# Patient Record
Sex: Male | Born: 1995 | Race: Black or African American | Hispanic: No | Marital: Single | Smoking: Light tobacco smoker
Health system: Southern US, Community
[De-identification: ages and names within clinical notes are randomized; demographics above are authoritative.]

## PROBLEM LIST (undated history)

## (undated) DIAGNOSIS — L309 Dermatitis, unspecified: Secondary | ICD-10-CM

## (undated) HISTORY — PX: WISDOM TOOTH EXTRACTION: SHX21

---
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
## 2017-04-07 IMAGING — CR DG HAND COMPLETE 3+V*L*
3 series · 3 of 3 positions shown · non-contrast
Comparison: None

CLINICAL DATA: Laceration to left second and third digits while
sharpening a knife, initial encounter

EXAM:
LEFT HAND - COMPLETE 3+ VIEW

[hand pa]
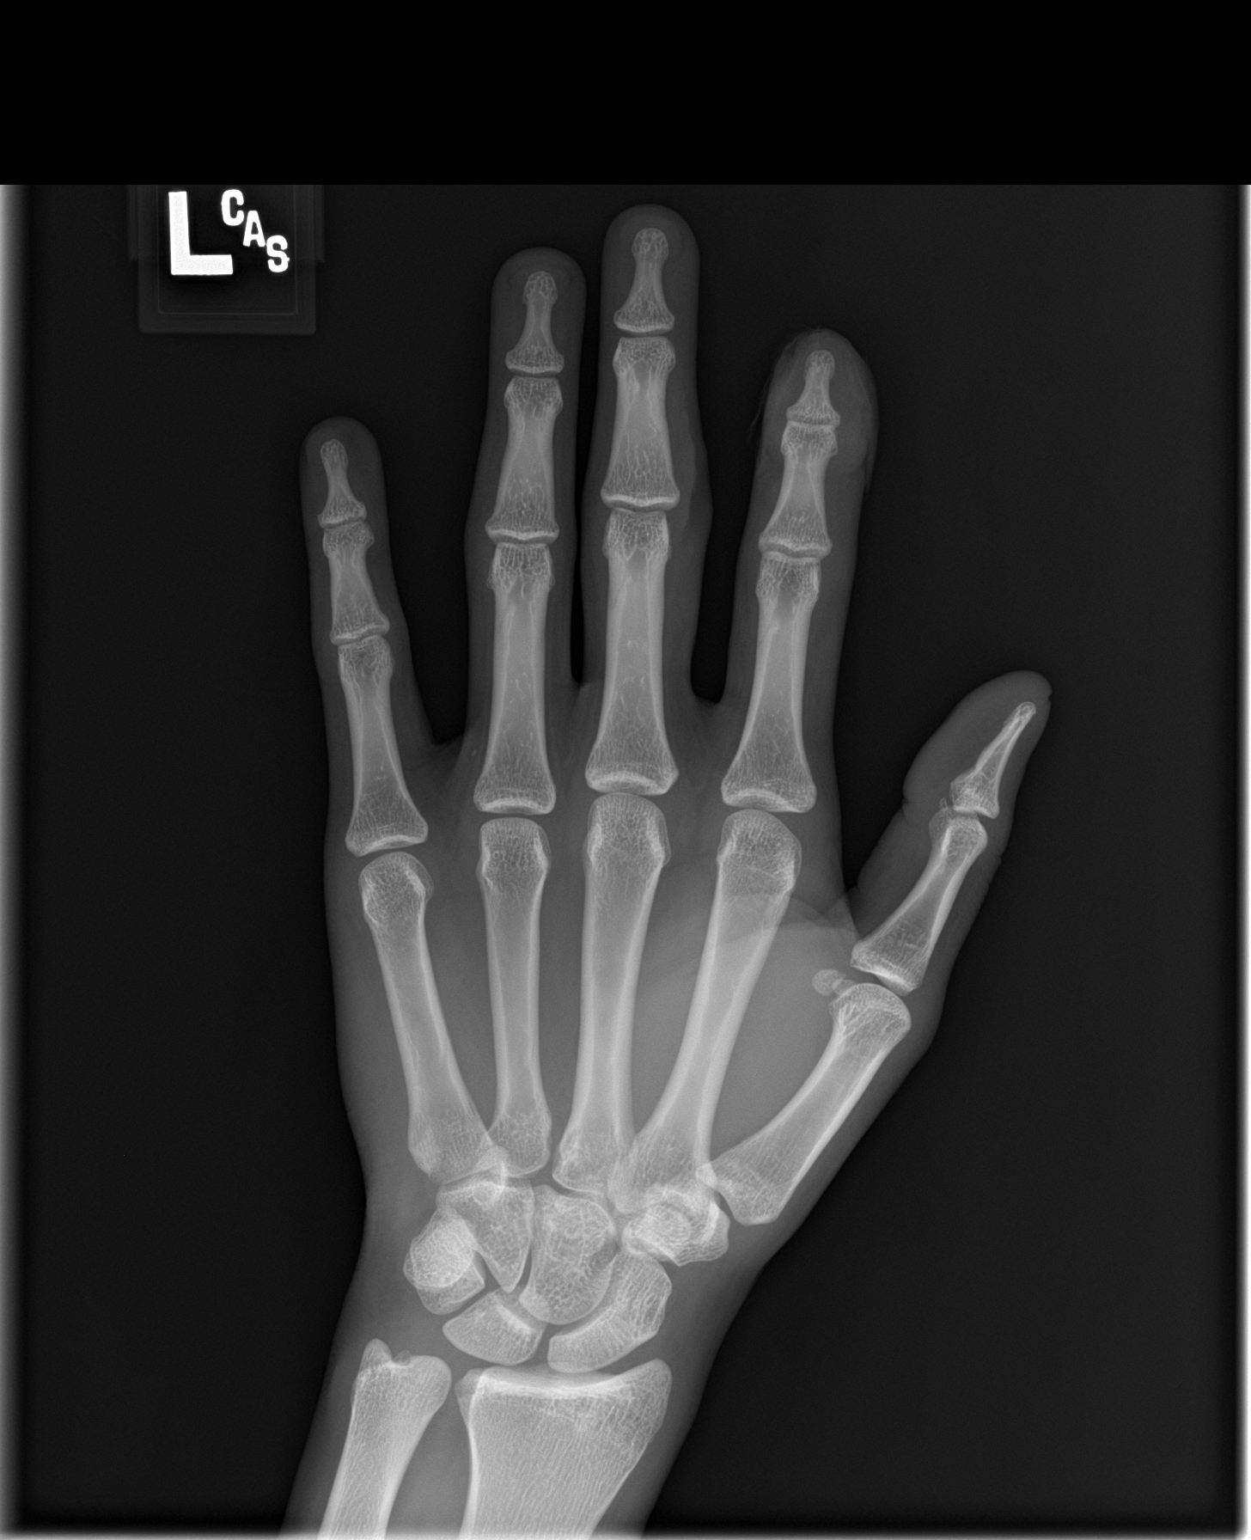

[hand obl]
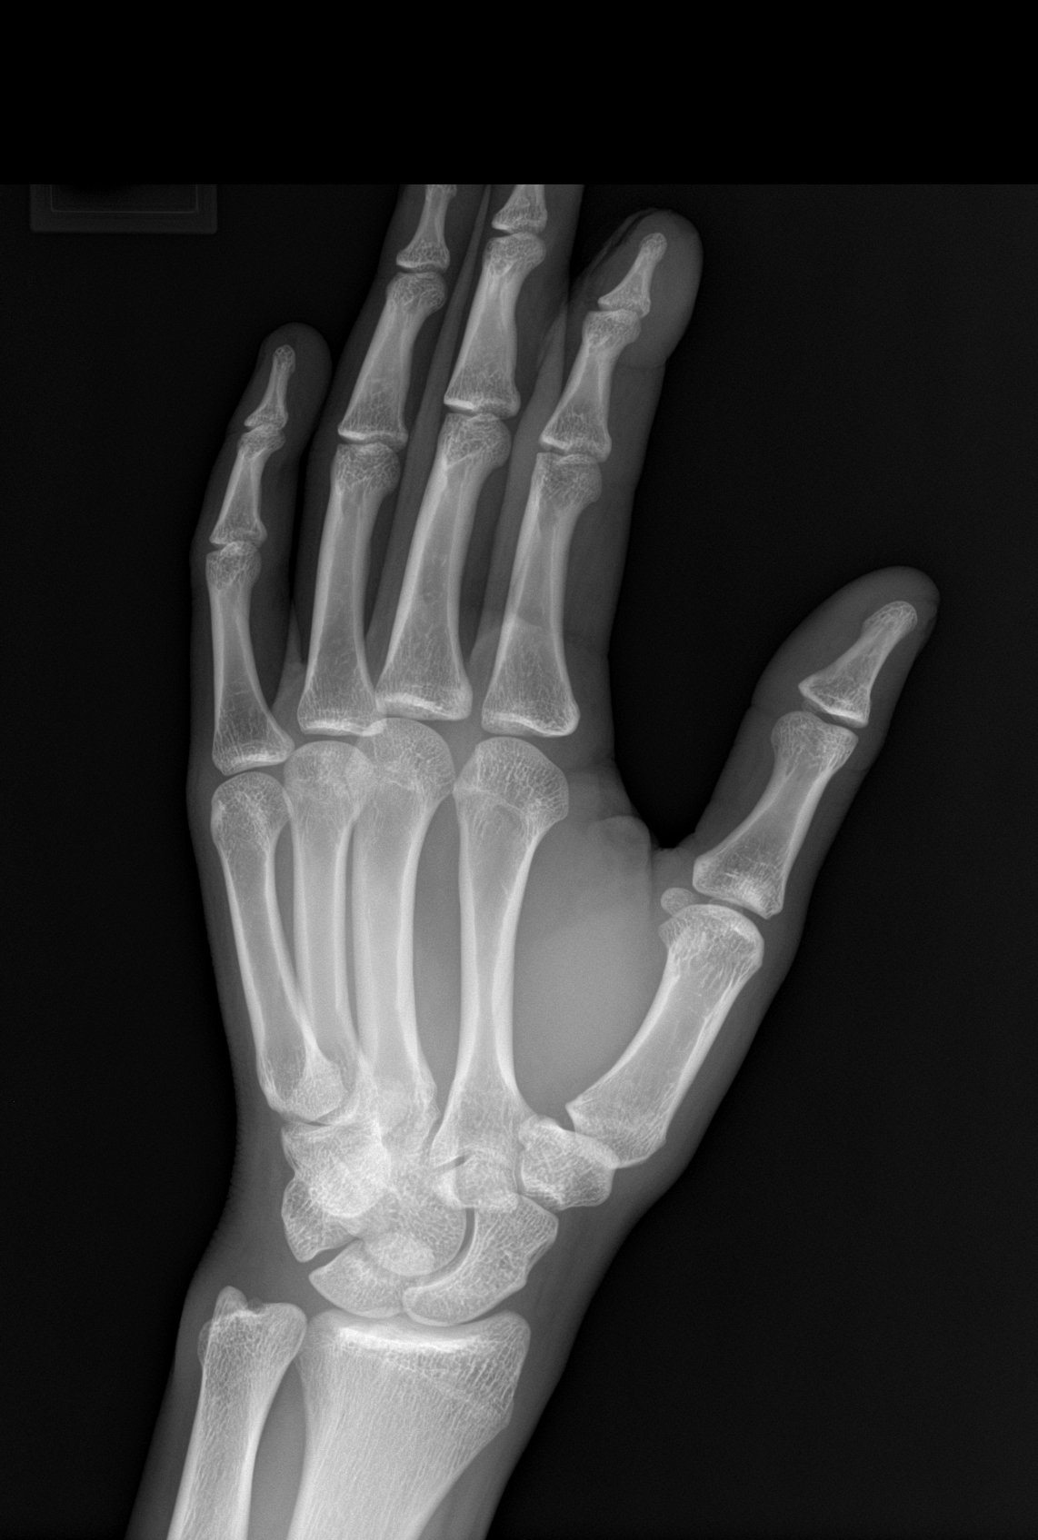

[hand lat]
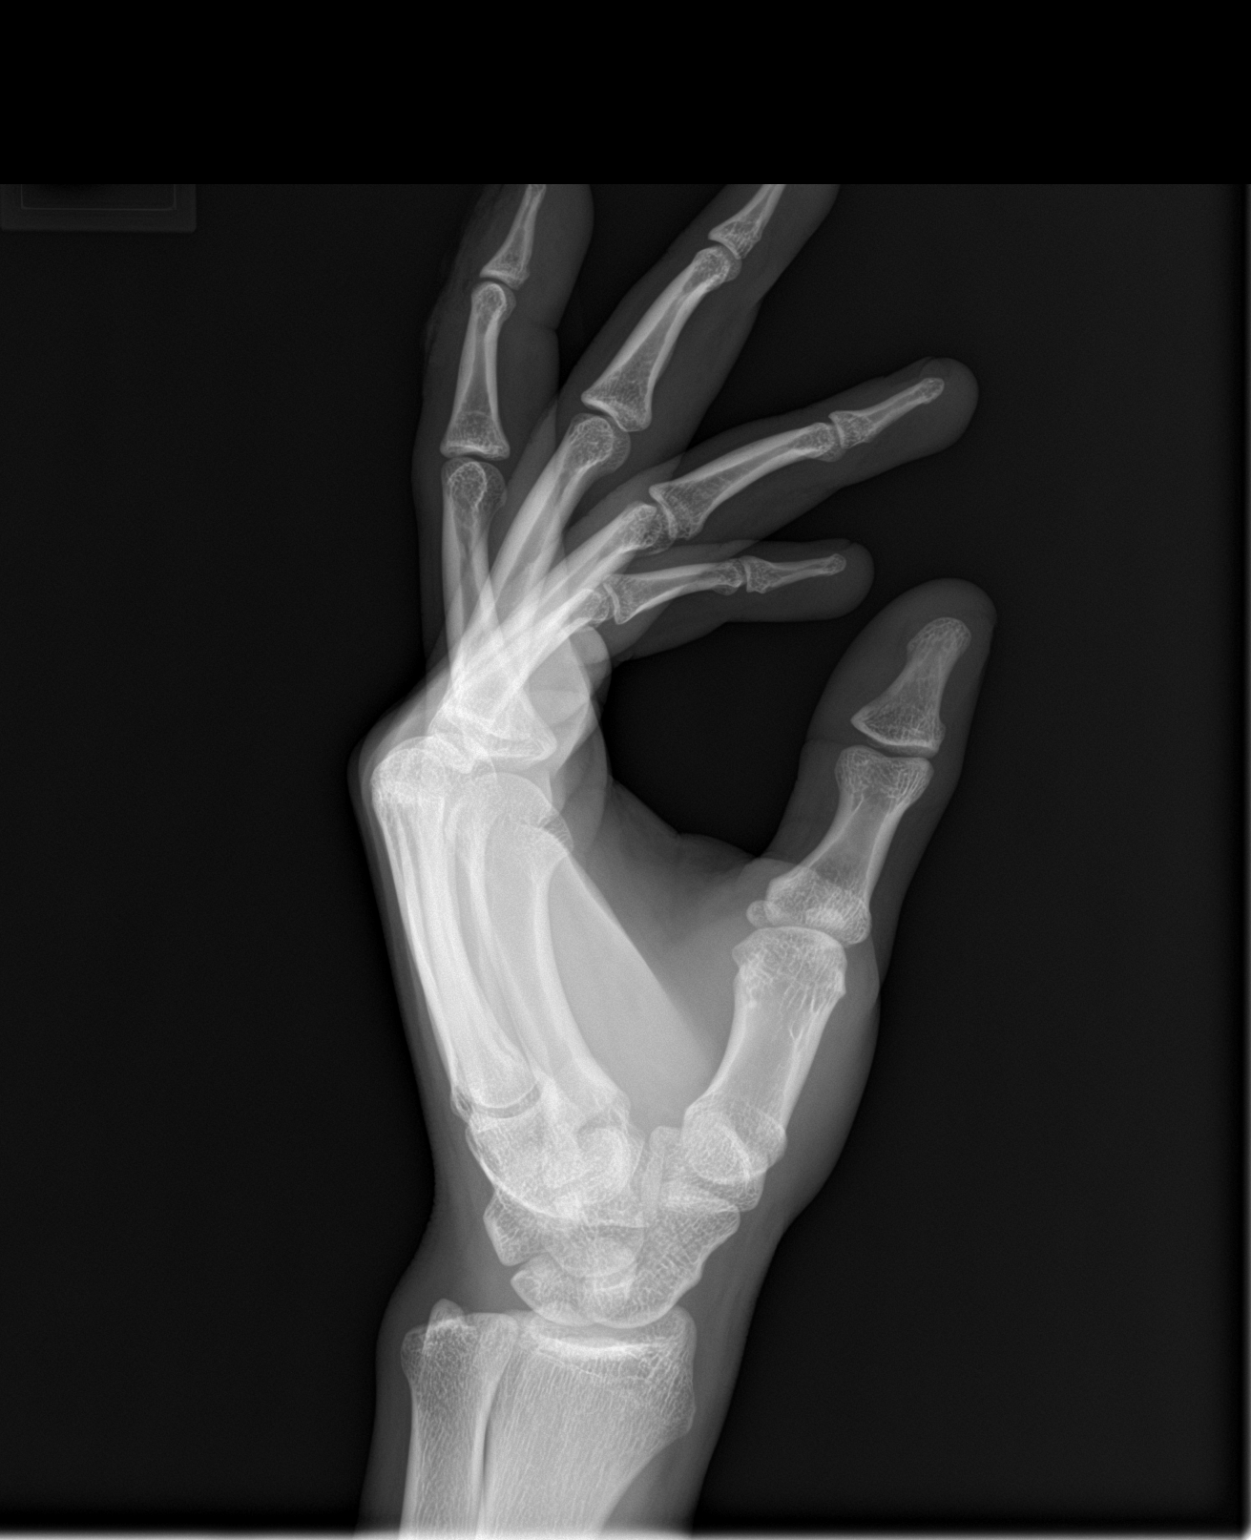

[3 of 3 positions shown; findings below may reference images not displayed]

FINDINGS: Soft tissue injury is noted consistent with the patient's given
clinical history. No underlying bony abnormality is seen.
IMPRESSION: No acute bony abnormality noted.

## 2017-04-13 ENCOUNTER — Encounter (HOSPITAL_COMMUNITY): Payer: Self-pay | Admitting: Nurse Practitioner

## 2017-04-13 ENCOUNTER — Other Ambulatory Visit: Payer: Self-pay

## 2017-04-13 ENCOUNTER — Emergency Department (HOSPITAL_COMMUNITY)
Admission: EM | Admit: 2017-04-13 | Discharge: 2017-04-13 | Disposition: A | Discharge status: Home / Self Care | Payer: Self-pay | Attending: Emergency Medicine | Admitting: Emergency Medicine

## 2017-04-13 DIAGNOSIS — Z202 Contact with and (suspected) exposure to infections with a predominantly sexual mode of transmission: Secondary | ICD-10-CM | POA: Insufficient documentation

## 2017-04-13 DIAGNOSIS — Z79899 Other long term (current) drug therapy: Secondary | ICD-10-CM | POA: Insufficient documentation

## 2017-04-13 DIAGNOSIS — F17200 Nicotine dependence, unspecified, uncomplicated: Secondary | ICD-10-CM | POA: Insufficient documentation

## 2017-04-13 MED ORDER — LIDOCAINE HCL (PF) 1 % IJ SOLN
INTRAMUSCULAR | Status: AC
  Administered 2017-04-13: 1.5 mL
  Filled 2017-04-13: qty 5

## 2017-04-13 MED ORDER — CEFTRIAXONE SODIUM 250 MG IJ SOLR
250.0000 mg | Freq: Once | INTRAMUSCULAR | Status: AC
  Administered 2017-04-13: 250 mg via INTRAMUSCULAR
  Filled 2017-04-13: qty 250

## 2017-04-13 MED ORDER — AZITHROMYCIN 250 MG PO TABS
1000.0000 mg | ORAL_TABLET | Freq: Once | ORAL | Status: AC
  Administered 2017-04-13: 1000 mg via ORAL
  Filled 2017-04-13: qty 4

## 2017-04-13 MED ORDER — METRONIDAZOLE 500 MG PO TABS
2000.0000 mg | ORAL_TABLET | Freq: Once | ORAL | Status: AC
  Administered 2017-04-13: 2000 mg via ORAL
  Filled 2017-04-13: qty 4

## 2017-04-13 NOTE — Discharge Instructions (Signed)
Follow up with the health department for any additional STD screening or treatment.

## 2017-04-13 NOTE — ED Triage Notes (Signed)
Pt states that his girlfriend notified him that she was diagnosed and treated for and STI and was advised to notify him to come in for treatment as well.He adds that she did not specify what kind of infection she was treated for. Pt denies any symptoms of penile discharge or dysuria.

## 2017-04-13 NOTE — ED Notes (Signed)
PT DISCHARGED. INSTRUCTIONS GIVEN. AAOX4. PT IN NO APPARENT DISTRESS OR PAIN. THE OPPORTUNITY TO ASK QUESTIONS WAS PROVIDED. 

## 2017-04-13 NOTE — ED Provider Notes (Signed)
[facility]-EMERGENCY DEPT Provider Note   CSN: [id number] Arrival date & time: 04/13/17  1920     History   Chief Complaint Chief Complaint  Patient presents with  . Exposure to STD    HPI Hayden Wade is a 21 y.o. male who presents to the ED for STD treatment. Patient reports that his girlfriend was here earlier due to a positive culture and had to get medications for the infection. She notified him that he needed to come in for treatment. Patient thinks his girlfriend had GC and something else. Patient reports that he has no symptoms.    HPI  Past Medical History:  Diagnosis Date  . Eczema     There are no active problems to display for this patient.   Past Surgical History:  Procedure Laterality Date  . WISDOM TOOTH EXTRACTION         Home Medications    Prior to Admission medications   Medication Sig Start Date End Date Taking? Authorizing Provider  cephALEXin (KEFLEX) 500 MG capsule Take 1 capsule (500 mg total) by mouth 4 (four) times daily. 08/24/15   Harris, Cammy CopaAbigail, PA-C  naproxen (NAPROSYN) 500 MG tablet Take 1 tablet (500 mg total) by mouth 2 (two) times daily. 08/24/15   Arthor CaptainHarris, Abigail, PA-C    Family History History reviewed. No pertinent family history.  Social History Social History   Tobacco Use  . Smoking status: Light Tobacco Smoker  . Smokeless tobacco: Never Used  Substance Use Topics  . Alcohol use: Yes  . Drug use: No     Allergies   Pork-derived products   Review of Systems Review of Systems  Genitourinary: Negative for discharge, dysuria, frequency, genital sores, penile pain, scrotal swelling, testicular pain and urgency.  All other systems reviewed and are negative.    Physical Exam Updated Vital Signs BP (!) 149/89 (BP Location: Left Arm)   Pulse (!) 101   Temp 98.7 F (37.1 C) (Oral)   Resp 18   SpO2 100%   Physical Exam  Constitutional: He appears well-developed and well-nourished. No  distress.  Eyes: EOM are normal.  Neck: Neck supple.  Cardiovascular: Tachycardia present.  Pulmonary/Chest: Effort normal.  Musculoskeletal: Normal range of motion.  Neurological: He is alert.  Skin: Skin is warm and dry.  Psychiatric: He has a normal mood and affect.  Nursing note and vitals reviewed.    ED Treatments / Results  Labs (all labs ordered are listed, but only abnormal results are displayed) Labs Reviewed  RPR  HIV ANTIBODY (ROUTINE TESTING)  GC/CHLAMYDIA PROBE AMP [facility]   Radiology No results found.  Procedures Procedures (including critical care time)  Medications Ordered in ED Medications  cefTRIAXone (ROCEPHIN) injection 250 mg (not administered)  azithromycin (ZITHROMAX) tablet 1,000 mg (not administered)  metroNIDAZOLE (FLAGYL) tablet 2,000 mg (not administered)     Initial Impression / Assessment and Plan / ED Course  I have reviewed the triage vital signs and the nursing notes. 21 y.o. male here for treatment for STD after his partner tested positive. Patient treated here in the ED with Rocephin, Zithromax and Flagyl. [facility] for any additional screening or treatment for STD's. Discussed safe sex and discussed tests pending.   Final Clinical Impressions(s) / ED Diagnoses   Final diagnoses:  STD exposure    ED Discharge Orders    None       FriendlyNeese, PattisonHope M,  NP 04/13/17 2109    Arby BarrettePfeiffer, Marcy, MD 04/15/17 [phone]

## 2017-04-14 LAB — RPR: RPR: NONREACTIVE

## 2017-04-14 LAB — HIV ANTIBODY (ROUTINE TESTING W REFLEX): HIV SCREEN 4TH GENERATION: NONREACTIVE

## 2018-04-15 ENCOUNTER — Emergency Department (HOSPITAL_COMMUNITY)
Admission: EM | Admit: 2018-04-15 | Discharge: 2018-04-15 | Disposition: A | Discharge status: Home / Self Care | Payer: Self-pay | Attending: Emergency Medicine | Admitting: Emergency Medicine

## 2018-04-15 ENCOUNTER — Emergency Department (HOSPITAL_COMMUNITY): Payer: Self-pay

## 2018-04-15 ENCOUNTER — Other Ambulatory Visit: Payer: Self-pay

## 2018-04-15 ENCOUNTER — Encounter (HOSPITAL_COMMUNITY): Payer: Self-pay | Admitting: Emergency Medicine

## 2018-04-15 DIAGNOSIS — F17200 Nicotine dependence, unspecified, uncomplicated: Secondary | ICD-10-CM | POA: Insufficient documentation

## 2018-04-15 DIAGNOSIS — R0789 Other chest pain: Secondary | ICD-10-CM | POA: Insufficient documentation

## 2018-04-15 LAB — POCT I-STAT TROPONIN I: Troponin i, poc: 0.03 ng/mL (ref 0.00–0.08)

## 2018-04-15 LAB — CBC
HEMATOCRIT: 47.3 % (ref 39.0–52.0)
Hemoglobin: 16 g/dL (ref 13.0–17.0)
MCH: 32.3 pg (ref 26.0–34.0)
MCHC: 33.8 g/dL (ref 30.0–36.0)
MCV: 95.6 fL (ref 80.0–100.0)
Platelets: 273 10*3/uL (ref 150–400)
RBC: 4.95 MIL/uL (ref 4.22–5.81)
RDW: 12.1 % (ref 11.5–15.5)
WBC: 8.2 10*3/uL (ref 4.0–10.5)
nRBC: 0 % (ref 0.0–0.2)

## 2018-04-15 LAB — BASIC METABOLIC PANEL
Anion gap: 5 (ref 5–15)
BUN: 10 mg/dL (ref 6–20)
CHLORIDE: 105 mmol/L (ref 98–111)
CO2: 28 mmol/L (ref 22–32)
CREATININE: 0.91 mg/dL (ref 0.61–1.24)
Calcium: 8.8 mg/dL — ABNORMAL LOW (ref 8.9–10.3)
GFR calc Af Amer: >60 mL/min (ref >60)
GFR calc non Af Amer: >60 mL/min (ref >60)
Glucose, Bld: 110 mg/dL — ABNORMAL HIGH (ref 70–99)
Potassium: 4 mmol/L (ref 3.5–5.1)
SODIUM: 138 mmol/L (ref 135–145)

## 2018-04-15 MED ORDER — IBUPROFEN 800 MG PO TABS: 800.0000 mg | ORAL_TABLET | Freq: Three times a day (TID) | ORAL | 0 refills | Status: AC | PRN

## 2018-04-15 NOTE — ED Provider Notes (Signed)
[facility]-EMERGENCY DEPT Provider Note   CSN: [id number] Arrival date & time: 04/15/18  1502     History   Chief Complaint Chief Complaint  Patient presents with  . Chest Pain    HPI Hayden Wade is a 22 y.o. male.  The history is provided by the patient. No language interpreter was used.  Chest Pain     Hayden Wade is a 22 y.o. male who presents to the Emergency Department complaining of chest pain. He reports of four days of central sharp chest pain. Symptoms began four days ago when he was coughing. Over the last two days he is noted that he has pain to his central chest when he bends to pick up box projects. He has had a cough for about a week and a half, overall the cough is resolving. No hemoptysis, shortness of breath, pain on inspiration, fevers, abdominal pain, nausea, vomiting, leg swelling or pain. He does smoke cigarettes. No drug use. No personal or family history of blood clots. He has not tried anything for the pain. Past Medical History:  Diagnosis Date  . Eczema     There are no active problems to display for this patient.   Past Surgical History:  Procedure Laterality Date  . WISDOM TOOTH EXTRACTION          Home Medications    Prior to Admission medications   Medication Sig Start Date End Date Taking? Authorizing Provider  cephALEXin (KEFLEX) 500 MG capsule Take 1 capsule (500 mg total) by mouth 4 (four) times daily. Patient not taking: Reported on 04/15/2018 08/24/15   Arthor Captain, PA-C  ibuprofen (ADVIL,MOTRIN) 800 MG tablet Take 1 tablet (800 mg total) by mouth every 8 (eight) hours as needed. 04/15/18   Tilden Fossa, MD  naproxen (NAPROSYN) 500 MG tablet Take 1 tablet (500 mg total) by mouth 2 (two) times daily. Patient not taking: Reported on 04/15/2018 08/24/15   Arthor Captain, PA-C    Family History No family history on file.  Social History Social History   Tobacco Use  . Smoking status: Light  Tobacco Smoker  . Smokeless tobacco: Never Used  Substance Use Topics  . Alcohol use: Yes  . Drug use: No     Allergies   Shellfish allergy and Pork-derived products   Review of Systems Review of Systems  Cardiovascular: Positive for chest pain.  All other systems reviewed and are negative.    Physical Exam Updated Vital Signs BP 138/85 (BP Location: Right Arm)   Pulse 91   Temp 97.9 F (36.6 C) (Oral)   Resp 17   Ht 5' 7.5" (1.715 m)   Wt 70.3 kg   SpO2 100%   BMI 23.92 kg/m   Physical Exam  Constitutional: He is oriented to person, place, and time. He appears well-developed and well-nourished.  HENT:  Head: Normocephalic and atraumatic.  Cardiovascular: Normal rate and regular rhythm.  No murmur heard. Pulmonary/Chest: Effort normal and breath sounds normal. No respiratory distress. He exhibits no tenderness.  Abdominal: Soft. There is no tenderness. There is no rebound and no guarding.  Musculoskeletal: He exhibits no edema or tenderness.  2+ radial pulses bilaterally  Neurological: He is alert and oriented to person, place, and time.  Skin: Skin is warm and dry.  Psychiatric: He has a normal mood and affect. His behavior is normal.  Nursing note and vitals reviewed.    ED Treatments / Results  Labs (all labs ordered are  listed, but only abnormal results are displayed) Labs Reviewed  BASIC METABOLIC PANEL - Abnormal; Notable for the following components:      Result Value   Glucose, Bld 110 (*)    Calcium 8.8 (*)    All other components within normal limits  CBC  I-STAT TROPONIN, ED  POCT I-STAT TROPONIN I    EKG EKG Interpretation  Date/Time:  Monday April 15 2018 15:08:54 EST Ventricular Rate:  93 PR Interval:    QRS Duration: 80 QT Interval:  327 QTC Calculation: 407 R Axis:   98 Text Interpretation:  Sinus rhythm Borderline right axis deviation no prior available for comparison Confirmed by Tilden Fossaees, Daden Mahany [phone]) on 04/15/2018  4:21:39 PM   Radiology Dg Chest 2 View  Result Date: 04/15/2018 CLINICAL DATA:  Central chest pain, 5 days duration. Productive cough. EXAM: CHEST - 2 VIEW COMPARISON:  None. FINDINGS: Heart size is normal. Mediastinal shadows are normal. The lungs are clear. No bronchial thickening. No infiltrate, mass, effusion or collapse. Pulmonary vascularity is normal. No bony abnormality. IMPRESSION: Normal chest Electronically Signed   By: Paulina FusiMark  Shogry M.D.   On: 04/15/2018 15:45    Procedures Procedures (including critical care time)  Medications Ordered in ED Medications - No data to display   Initial Impression / Assessment and Plan / ED Course  I have reviewed the triage vital signs and the nursing notes.  Pertinent labs & imaging results that were available during my care of the patient were reviewed by me and considered in my medical decision making (see chart for details).     Pt presents to the emergency department for evaluation of four days of chest pain. He is in no acute distress on evaluation with clear lungs. No current evidence of pneumonia. Presentation is not consistent with ACS, PE, dissection, pericarditis. Discussed with patient home care for chest wall pain. Discussed outpatient follow-up and return precautions.  Final Clinical Impressions(s) / ED Diagnoses   Final diagnoses:  Chest wall pain    ED Discharge Orders         Ordered    ibuprofen (ADVIL,MOTRIN) 800 MG tablet  Every 8 hours PRN     04/15/18 1633           Tilden Fossaees, Iness Pangilinan, MD 04/15/18 1635

## 2018-04-15 NOTE — ED Triage Notes (Signed)
Pt c/o intermittent chest pains for 4 days. Today while at work was picking something up when pains happened and pt repots hurting so bad couldn't pick up the package. Reports cough with brownish phlegm for about week.

## 2018-09-28 ENCOUNTER — Emergency Department (HOSPITAL_COMMUNITY)
Admission: EM | Admit: 2018-09-28 | Discharge: 2018-09-29 | Disposition: A | Discharge status: Home / Self Care | Payer: Self-pay | Attending: Emergency Medicine | Admitting: Emergency Medicine

## 2018-09-28 ENCOUNTER — Encounter (HOSPITAL_COMMUNITY): Payer: Self-pay

## 2018-09-28 ENCOUNTER — Other Ambulatory Visit: Payer: Self-pay

## 2018-09-28 DIAGNOSIS — R369 Urethral discharge, unspecified: Secondary | ICD-10-CM | POA: Insufficient documentation

## 2018-09-28 DIAGNOSIS — R300 Dysuria: Secondary | ICD-10-CM | POA: Insufficient documentation

## 2018-09-28 DIAGNOSIS — N4889 Other specified disorders of penis: Secondary | ICD-10-CM | POA: Insufficient documentation

## 2018-09-28 DIAGNOSIS — F17200 Nicotine dependence, unspecified, uncomplicated: Secondary | ICD-10-CM | POA: Insufficient documentation

## 2018-09-28 NOTE — ED Notes (Signed)
Bed: WA13 Expected date:  Expected time:  Means of arrival:  Comments: 

## 2018-09-28 NOTE — ED Provider Notes (Signed)
[facility]-EMERGENCY DEPT Provider Note   CSN: [id number] Arrival date & time: 09/28/18  2104    History   Chief Complaint Chief Complaint  Patient presents with  . Penile Discharge    HPI Hayden Wade is a 23 y.o. male with a history of eczema who presents to the emergency department with a chief complaint of penile discharge.  The patient reports penile discharge and burning for the last 24 hours.  He denies urinary frequency or hesitancy, nausea, vomiting, fever, chills, abdominal pain, diarrhea, constipation or testicular pain or swelling.  He reports that he was recently sexually active with one new male partner.  He reports he wore a condom when they had vaginal intercourse, but did not use any kind of protection with oral sex.  No recent anal sex.  No treatment prior to arrival.     The history is provided by the patient. No language interpreter was used.    Past Medical History:  Diagnosis Date  . Eczema     There are no active problems to display for this patient.   Past Surgical History:  Procedure Laterality Date  . WISDOM TOOTH EXTRACTION          Home Medications    Prior to Admission medications   Medication Sig Start Date End Date Taking? Authorizing Provider  cephALEXin (KEFLEX) 500 MG capsule Take 1 capsule (500 mg total) by mouth 4 (four) times daily. Patient not taking: Reported on 04/15/2018 08/24/15   Arthor Captain, PA-C  ibuprofen (ADVIL,MOTRIN) 800 MG tablet Take 1 tablet (800 mg total) by mouth every 8 (eight) hours as needed. 04/15/18   Tilden Fossa, MD  naproxen (NAPROSYN) 500 MG tablet Take 1 tablet (500 mg total) by mouth 2 (two) times daily. Patient not taking: Reported on 04/15/2018 08/24/15   Arthor Captain, PA-C    Family History History reviewed. No pertinent family history.  Social History Social History   Tobacco Use  . Smoking status: Light Tobacco Smoker  . Smokeless tobacco: Never Used   Substance Use Topics  . Alcohol use: Yes  . Drug use: No     Allergies   Shellfish allergy and Pork-derived products   Review of Systems Review of Systems  Constitutional: Negative for activity change, chills and fever.  HENT: Negative for congestion and mouth sores.   Respiratory: Negative for shortness of breath.   Cardiovascular: Negative for chest pain.  Gastrointestinal: Negative for abdominal pain, diarrhea, nausea and vomiting.  Genitourinary: Positive for discharge and penile pain. Negative for dysuria, frequency, hematuria and urgency.  Musculoskeletal: Negative for back pain.  Skin: Negative for rash.  Neurological: Negative for dizziness, weakness and numbness.     Physical Exam Updated Vital Signs BP 137/80 (BP Location: Left Arm)   Pulse 70   Temp 98.1 F (36.7 C) (Oral)   Resp 18   Ht 5\' 8"  (1.727 m)   Wt 74.8 kg   SpO2 97%   BMI 25.09 kg/m   Physical Exam Vitals signs and nursing note reviewed.  Constitutional:      Appearance: He is well-developed.  HENT:     Head: Normocephalic.  Eyes:     Conjunctiva/sclera: Conjunctivae normal.  Neck:     Musculoskeletal: Neck supple.  Cardiovascular:     Rate and Rhythm: Normal rate and regular rhythm.     Heart sounds: No murmur.  Pulmonary:     Effort: Pulmonary effort is normal.  Abdominal:  General: There is no distension.     Palpations: Abdomen is soft.  Genitourinary:    Penis: Discharge present. No phimosis, paraphimosis, erythema or tenderness.      Scrotum/Testes: Normal.  Skin:    General: Skin is warm and dry.  Neurological:     Mental Status: He is alert.  Psychiatric:        Behavior: Behavior normal.      ED Treatments / Results  Labs (all labs ordered are listed, but only abnormal results are displayed) Labs Reviewed  GC/CHLAMYDIA PROBE AMP ([place]) NOT AT [facility]    EKG None  Radiology No results found.  Procedures Procedures (including critical care time)   Medications Ordered in ED Medications  cefTRIAXone (ROCEPHIN) injection 250 mg (has no administration in time range)  azithromycin (ZITHROMAX) tablet 1,000 mg (has no administration in time range)  sterile water (preservative free) injection (has no administration in time range)     Initial Impression / Assessment and Plan / ED Course  I have reviewed the triage vital signs and the nursing notes.  Pertinent labs & imaging results that were available during my care of the patient were reviewed by me and considered in my medical decision making (see chart for details).        23 year old male presenting with penile discharge and pain for the last 24 hours.  No constitutional symptoms.  No nausea, vomiting, or diarrhea.  He reports that he recently had a new male sexual partner.  States that he wore a condom during sexual intercourse, but not while having oral sex.  He is having no other urinary complaints.  Patient is afebrile without abdominal tenderness, abdominal pain or painful bowel movements to indicate prostatitis.  No tenderness to palpation of the testes or epididymis to suggest orchitis or epididymitis.  STD cultures obtained including gonorrhea and chlamydia.  Declined HIV and syphilis testing.  Patient to be discharged with instructions to follow up with PCP. Discussed importance of using protection when sexually active. Pt understands that they have GC/Chlamydia cultures pending and that they will need to inform all sexual partners if results return positive. Patient has been treated prophylactically with azithromycin and Rocephin.    Final Clinical Impressions(s) / ED Diagnoses   Final diagnoses:  Penile discharge    ED Discharge Orders    None       Barkley BoardsMcDonald, Alessia Gonsalez A, PA-C 09/29/18 0114    Molpus, Jonny RuizJohn, MD 09/29/18 [id number]

## 2018-09-28 NOTE — ED Triage Notes (Signed)
For 2 days white penile discharge and burning with urination voiced.

## 2018-09-29 ENCOUNTER — Other Ambulatory Visit: Payer: Self-pay

## 2018-09-29 MED ORDER — AZITHROMYCIN 250 MG PO TABS
1000.0000 mg | ORAL_TABLET | Freq: Once | ORAL | Status: AC
  Administered 2018-09-29: 01:00:00 1000 mg via ORAL
  Filled 2018-09-29: qty 4

## 2018-09-29 MED ORDER — STERILE WATER FOR INJECTION IJ SOLN
INTRAMUSCULAR | Status: AC
  Administered 2018-09-29: 1.2 mL
  Filled 2018-09-29: qty 10

## 2018-09-29 MED ORDER — CEFTRIAXONE SODIUM 250 MG IJ SOLR
250.0000 mg | Freq: Once | INTRAMUSCULAR | Status: AC
  Administered 2018-09-29: 250 mg via INTRAMUSCULAR
  Filled 2018-09-29: qty 250

## 2018-09-29 NOTE — Discharge Instructions (Addendum)
Thank you for allowing me to provide your care today in the emergency department.  Your gonorrhea and chlamydia tests are pending.  You have already been treated for both. If any of these tests are positive, someone from the hospital will call you at the number that we discussed.  You can also download the my chart application and use the number on your discharge paperwork to register for an account.  Lab results are typically available in the app 72 hours after they have resulted.  To protect yourself from these illnesses, you should wear a condom for all types of intercourse, including oral or anal sex.  [facility] recommends abstaining from all sexual activities for 7 days after being treated.  If any of your tests are positive, it is important that you let all of your sexual partners know so they can seek treatment.  It is also important to note that if you were treated and have sex with someone that has not been treated that you can be reinfected.  You declined blood testing for HIV or syphilis today.  Consider getting screened for these illnesses in the future since both are transmitted sexually.  Call the number on your discharge paperwork to get established with a primary care provider.   Return to the emergency department if you develop significantly worsening symptoms such as fever, chills, severe pain, redness, or swelling to the penis or testicles, or if the discharge from your penis does not improve.

## 2018-10-01 LAB — GC/CHLAMYDIA PROBE AMP (~~LOC~~) NOT AT ARMC
Chlamydia: NEGATIVE
Neisseria Gonorrhea: POSITIVE — AB

## 2018-12-27 ENCOUNTER — Other Ambulatory Visit: Payer: Self-pay

## 2018-12-27 ENCOUNTER — Encounter (HOSPITAL_COMMUNITY): Payer: Self-pay | Admitting: Emergency Medicine

## 2018-12-27 ENCOUNTER — Emergency Department (HOSPITAL_COMMUNITY)
Admission: EM | Admit: 2018-12-27 | Discharge: 2018-12-27 | Disposition: A | Discharge status: Home / Self Care | Payer: Self-pay | Attending: Emergency Medicine | Admitting: Emergency Medicine

## 2018-12-27 DIAGNOSIS — Z711 Person with feared health complaint in whom no diagnosis is made: Secondary | ICD-10-CM

## 2018-12-27 DIAGNOSIS — F17200 Nicotine dependence, unspecified, uncomplicated: Secondary | ICD-10-CM | POA: Insufficient documentation

## 2018-12-27 DIAGNOSIS — Z202 Contact with and (suspected) exposure to infections with a predominantly sexual mode of transmission: Secondary | ICD-10-CM | POA: Insufficient documentation

## 2018-12-27 LAB — URINALYSIS, COMPLETE (UACMP) WITH MICROSCOPIC
Bacteria, UA: NONE SEEN
Bilirubin Urine: NEGATIVE
Glucose, UA: NEGATIVE mg/dL
Hgb urine dipstick: NEGATIVE
Ketones, ur: 5 mg/dL — AB
Leukocytes,Ua: NEGATIVE
Nitrite: NEGATIVE
Protein, ur: NEGATIVE mg/dL
Specific Gravity, Urine: 1.024 (ref 1.005–1.030)
pH: 6 (ref 5.0–8.0)

## 2018-12-27 MED ORDER — CEFTRIAXONE SODIUM 250 MG IJ SOLR
250.0000 mg | Freq: Once | INTRAMUSCULAR | Status: AC
  Administered 2018-12-27: 250 mg via INTRAMUSCULAR
  Filled 2018-12-27: qty 250

## 2018-12-27 MED ORDER — AZITHROMYCIN 250 MG PO TABS
1000.0000 mg | ORAL_TABLET | Freq: Once | ORAL | Status: AC
  Administered 2018-12-27: 1000 mg via ORAL
  Filled 2018-12-27: qty 4

## 2018-12-27 MED ORDER — STERILE WATER FOR INJECTION IJ SOLN
INTRAMUSCULAR | Status: AC
  Administered 2018-12-27: 10 mL
  Filled 2018-12-27: qty 10

## 2018-12-27 MED ORDER — ONDANSETRON 4 MG PO TBDP
4.0000 mg | ORAL_TABLET | Freq: Once | ORAL | Status: AC
  Administered 2018-12-27: 4 mg via ORAL
  Filled 2018-12-27: qty 1

## 2018-12-27 NOTE — Discharge Instructions (Addendum)
Today you have been treated for gonorrhea and chlamydia.  The test to determine if you have these will take a few days. They will only call you if your tests come back positive, no news is good news. In the result that your tests are positive you have already been treated.  

## 2018-12-27 NOTE — ED Provider Notes (Signed)
[facility] DEPT Provider Note   CSN: [id number] Arrival date & time: 12/27/18  1502    History   Chief Complaint Chief Complaint  Patient presents with  . Exposure to STD    HPI Hayden Wade is a 23 y.o. male with no pertinent past medical history who presents today for concern of a sexually transmitted infection.  He reports that he and his girlfriend had a three-way with another person who then told him that she tested positive for chlamydia and trichomoniasis he thinks however is unsure.  He denies any symptoms.  No testicular pain.  No rectal pain or pain with bowel movements.  He states that he was wearing a condom during this.  He states that he does not think his partner is having any symptoms.      HPI  Past Medical History:  Diagnosis Date  . Eczema     There are no active problems to display for this patient.   Past Surgical History:  Procedure Laterality Date  . WISDOM TOOTH EXTRACTION          Home Medications    Prior to Admission medications   Medication Sig Start Date End Date Taking? Authorizing Provider  cephALEXin (KEFLEX) 500 MG capsule Take 1 capsule (500 mg total) by mouth 4 (four) times daily. Patient not taking: Reported on 04/15/2018 08/24/15   Margarita Mail, PA-C  ibuprofen (ADVIL,MOTRIN) 800 MG tablet Take 1 tablet (800 mg total) by mouth every 8 (eight) hours as needed. 04/15/18   Quintella Reichert, MD  naproxen (NAPROSYN) 500 MG tablet Take 1 tablet (500 mg total) by mouth 2 (two) times daily. Patient not taking: Reported on 04/15/2018 08/24/15   Margarita Mail, PA-C    Family History No family history on file.  Social History Social History   Tobacco Use  . Smoking status: Light Tobacco Smoker  . Smokeless tobacco: Never Used  Substance Use Topics  . Alcohol use: Yes  . Drug use: No     Allergies   Shellfish allergy and Pork-derived products   Review of Systems Review of Systems   Constitutional: Negative for chills and fever.  Gastrointestinal: Negative for blood in stool and rectal pain.  Genitourinary: Negative for decreased urine volume, discharge, dysuria, frequency, genital sores, penile pain, penile swelling, scrotal swelling, testicular pain and urgency.  Neurological: Negative for weakness and headaches.  All other systems reviewed and are negative.    Physical Exam Updated Vital Signs BP 131/69 (BP Location: Left Arm)   Pulse 88   Temp 98.6 F (37 C) (Oral)   Resp 18   Wt 74.3 kg   SpO2 99%   BMI 24.92 kg/m   Physical Exam Vitals signs and nursing note reviewed. Exam conducted with a chaperone present (Male ED tech).  Constitutional:      General: He is not in acute distress.    Appearance: He is well-developed. He is not diaphoretic.  HENT:     Head: Normocephalic and atraumatic.  Eyes:     General: No scleral icterus.       Right eye: No discharge.        Left eye: No discharge.     Conjunctiva/sclera: Conjunctivae normal.  Neck:     Musculoskeletal: Normal range of motion.  Cardiovascular:     Rate and Rhythm: Normal rate and regular rhythm.  Pulmonary:     Effort: Pulmonary effort is normal. No respiratory distress.     Breath sounds:  No stridor.  Genitourinary:    Penis: Normal.      Comments: No discharge Musculoskeletal:        General: No deformity.  Skin:    General: Skin is warm and dry.  Neurological:     Mental Status: He is alert.     Motor: No abnormal muscle tone.  Psychiatric:        Behavior: Behavior normal.      ED Treatments / Results  Labs (all labs ordered are listed, but only abnormal results are displayed) Labs Reviewed  URINALYSIS, COMPLETE (UACMP) WITH MICROSCOPIC - Abnormal; Notable for the following components:      Result Value   Ketones, ur 5 (*)    All other components within normal limits  GC/CHLAMYDIA PROBE AMP ([place]) NOT AT [facility]    EKG None  Radiology No results found.   Procedures Procedures (including critical care time)  Medications Ordered in ED Medications  cefTRIAXone (ROCEPHIN) injection 250 mg (250 mg Intramuscular Given 12/27/18 1651)  azithromycin (ZITHROMAX) tablet 1,000 mg (1,000 mg Oral Given 12/27/18 1651)  ondansetron (ZOFRAN-ODT) disintegrating tablet 4 mg (4 mg Oral Given 12/27/18 1651)  sterile water (preservative free) injection (10 mLs  Given 12/27/18 1654)     Initial Impression / Assessment and Plan / ED Course  I have reviewed the triage vital signs and the nursing notes.  Pertinent labs & imaging results that were available during my care of the patient were reviewed by me and considered in my medical decision making (see chart for details).       Patient is afebrile without , abdominal pain or painful bowel movements to indicate prostatitis.  No testicular pain  STD cultures obtained includinggonorrhea and chlamydia.  Patient was offered HIV and syphilis testing which he made the informed decision to refuse.  Patient to be discharged with instructions to follow up with PCP. Discussed importance of using protection when sexually active. Pt understands that they have GC/Chlamydia cultures pending and that they will need to inform all sexual partners if results return positive. Patient has been treated prophylactically with azithromycin and Rocephin.   Due to concerns of possible trichomoniasis urine microscopy was sent, none was seen.   Return precautions were discussed with patient who states their understanding.  At the time of discharge patient denied any unaddressed complaints or concerns.  Patient is agreeable for discharge home.   Final Clinical Impressions(s) / ED Diagnoses   Final diagnoses:  Concern about STD in male without diagnosis    ED Discharge Orders    None       Norman ClayHammond, Nashayla Telleria W, PA-C 12/27/18 1734    Loren RacerYelverton, David, MD 12/29/18 2235

## 2018-12-27 NOTE — ED Triage Notes (Signed)
Pt reports that one of his sexual partners informed him that they had STD and he needed treatment. Denies discharge, urinary problems or pains.

## 2018-12-31 LAB — GC/CHLAMYDIA PROBE AMP (~~LOC~~) NOT AT ARMC
Chlamydia: NEGATIVE
Neisseria Gonorrhea: NEGATIVE

## 2019-07-21 ENCOUNTER — Encounter (HOSPITAL_COMMUNITY): Payer: Self-pay

## 2019-07-21 ENCOUNTER — Emergency Department (HOSPITAL_COMMUNITY)
Admission: EM | Admit: 2019-07-21 | Discharge: 2019-07-21 | Disposition: A | Discharge status: Home / Self Care | Payer: Self-pay | Attending: Emergency Medicine | Admitting: Emergency Medicine

## 2019-07-21 ENCOUNTER — Other Ambulatory Visit: Payer: Self-pay

## 2019-07-21 DIAGNOSIS — F17200 Nicotine dependence, unspecified, uncomplicated: Secondary | ICD-10-CM | POA: Insufficient documentation

## 2019-07-21 DIAGNOSIS — R369 Urethral discharge, unspecified: Secondary | ICD-10-CM | POA: Insufficient documentation

## 2019-07-21 MED ORDER — CEFTRIAXONE SODIUM 250 MG IJ SOLR
250.0000 mg | Freq: Once | INTRAMUSCULAR | Status: AC
  Administered 2019-07-21: 02:00:00 250 mg via INTRAMUSCULAR
  Filled 2019-07-21: qty 250

## 2019-07-21 MED ORDER — METRONIDAZOLE 500 MG PO TABS: 500.0000 mg | ORAL_TABLET | Freq: Two times a day (BID) | ORAL | 0 refills | Status: AC

## 2019-07-21 MED ORDER — AZITHROMYCIN 250 MG PO TABS
1000.0000 mg | ORAL_TABLET | Freq: Once | ORAL | Status: AC
  Administered 2019-07-21: 02:00:00 1000 mg via ORAL
  Filled 2019-07-21: qty 4

## 2019-07-21 MED ORDER — LIDOCAINE HCL 1 % IJ SOLN
INTRAMUSCULAR | Status: AC
  Administered 2019-07-21: 02:00:00 0.9 mL
  Filled 2019-07-21: qty 20

## 2019-07-21 NOTE — ED Provider Notes (Signed)
[place] DEPT Provider Note   CSN: [id number] Arrival date & time: 07/21/19  0109     History Chief Complaint  Patient presents with  . Penile Discharge    Hayden Wade is a 24 y.o. male.  Patient with history of recurrent STD infections presents with penile discharge since this morning. No fever, abdominal pain, nausea, testicular pain or trouble urinating.   The history is provided by the patient. No language interpreter was used.       Past Medical History:  Diagnosis Date  . Eczema     There are no problems to display for this patient.   Past Surgical History:  Procedure Laterality Date  . WISDOM TOOTH EXTRACTION         No family history on file.  Social History   Tobacco Use  . Smoking status: Light Tobacco Smoker  . Smokeless tobacco: Never Used  Substance Use Topics  . Alcohol use: Yes  . Drug use: No    Home Medications Prior to Admission medications   Medication Sig Start Date End Date Taking? Authorizing Provider  cephALEXin (KEFLEX) 500 MG capsule Take 1 capsule (500 mg total) by mouth 4 (four) times daily. Patient not taking: Reported on 04/15/2018 08/24/15   Margarita Mail, PA-C  ibuprofen (ADVIL,MOTRIN) 800 MG tablet Take 1 tablet (800 mg total) by mouth every 8 (eight) hours as needed. 04/15/18   Quintella Reichert, MD  naproxen (NAPROSYN) 500 MG tablet Take 1 tablet (500 mg total) by mouth 2 (two) times daily. Patient not taking: Reported on 04/15/2018 08/24/15   Margarita Mail, PA-C    Allergies    Shellfish allergy and Pork-derived products  Review of Systems   Review of Systems  Constitutional: Negative for chills and fever.  Gastrointestinal: Negative.  Negative for abdominal pain and nausea.  Genitourinary: Positive for discharge. Negative for difficulty urinating and testicular pain.  Musculoskeletal: Negative.   Skin: Negative.   Neurological: Negative.     Physical Exam Updated Vital  Signs BP (!) 144/89 (BP Location: Left Arm)   Pulse 77   Temp 97.7 F (36.5 C) (Oral)   Resp 16   SpO2 100%   Physical Exam Vitals and nursing note reviewed.  Constitutional:      Appearance: He is well-developed.  Pulmonary:     Effort: Pulmonary effort is normal.  Genitourinary:    Comments: Circumcised penis without visualized discharge. No testicular tenderness.  Musculoskeletal:        General: Normal range of motion.     Cervical back: Normal range of motion.  Lymphadenopathy:     Lower Body: Right inguinal adenopathy present. Left inguinal adenopathy present.  Skin:    General: Skin is warm and dry.  Neurological:     Mental Status: He is alert and oriented to person, place, and time.     ED Results / Procedures / Treatments   Labs (all labs ordered are listed, but only abnormal results are displayed) Labs Reviewed - No data to display  EKG None  Radiology No results found.  Procedures Procedures (including critical care time)  Medications Ordered in ED Medications  cefTRIAXone (ROCEPHIN) injection 250 mg (250 mg Intramuscular Given 07/21/19 0200)  azithromycin (ZITHROMAX) tablet 1,000 mg (1,000 mg Oral Given 07/21/19 0159)  lidocaine (XYLOCAINE) 1 % (with pres) injection (0.9 mLs  Given 07/21/19 0202)    ED Course  I have reviewed the triage vital signs and the nursing notes.  Pertinent labs &  imaging results that were available during my care of the patient were reviewed by me and considered in my medical decision making (see chart for details).    MDM Rules/Calculators/A&P                      Patient to ED with penile discharge.   Chart reviewed. He has been seen multiple times in the past with positive cultures for gonorrhea and chlamydia, now having penile discharge.   Discussed risks of recurrent STD infections, safe sex practices, spread of infection in the community, and the [facility] as an available resource for the diagnosis and  treatment of STD's.   Final Clinical Impression(s) / ED Diagnoses Final diagnoses:  None   1. Penile discharge  Rx / DC Orders ED Discharge Orders    None       Elpidio Anis, Cordelia Poche 07/21/19 0735    Ward, Layla Maw, DO 07/21/19 2304

## 2019-07-21 NOTE — ED Triage Notes (Signed)
Patient arrived stating that he began having penile drainage this morning. States he would like to be tested for STDs.
